# Patient Record
Sex: Female | Born: 1980 | Race: Black or African American | Hispanic: No | Smoking: Never smoker
Health system: Southern US, Community
[De-identification: ages and names within clinical notes are randomized; demographics above are authoritative.]

## PROBLEM LIST (undated history)

## (undated) DIAGNOSIS — D649 Anemia, unspecified: Secondary | ICD-10-CM

---

## 2006-06-10 ENCOUNTER — Other Ambulatory Visit: Admission: RE | Admit: 2006-06-10 | Discharge: 2006-06-10 | Payer: Self-pay | Admitting: Obstetrics and Gynecology

## 2006-08-20 ENCOUNTER — Ambulatory Visit (HOSPITAL_COMMUNITY): Admission: RE | Admit: 2006-08-20 | Discharge: 2006-08-20 | Payer: Self-pay | Admitting: Obstetrics and Gynecology

## 2006-11-19 ENCOUNTER — Other Ambulatory Visit: Admission: RE | Admit: 2006-11-19 | Discharge: 2006-11-19 | Payer: Self-pay | Admitting: Obstetrics and Gynecology

## 2008-09-06 IMAGING — US US OB COMP +14 WK
1 series · 14 of 28 positions shown · non-contrast
Comparison: none

OBSTETRICAL ULTRASOUND:

 This ultrasound exam was performed in the [HOSPITAL] Ultrasound Department.  The OB US report was generated in the AS system, and faxed to the ordering physician.  This report is also available in [REDACTED] PACS.

[Series 1: us ob comp +14 wk · 0.26mm/px · 14 of 88 slices shown]
[im 4/88]
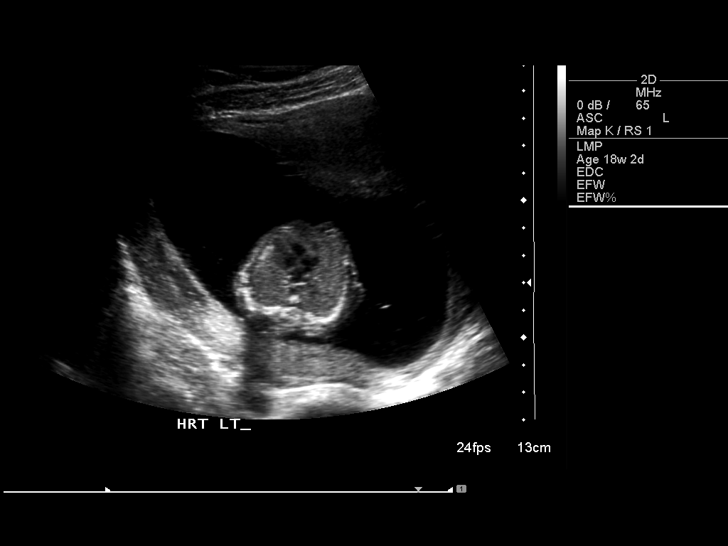
[im 10/88]
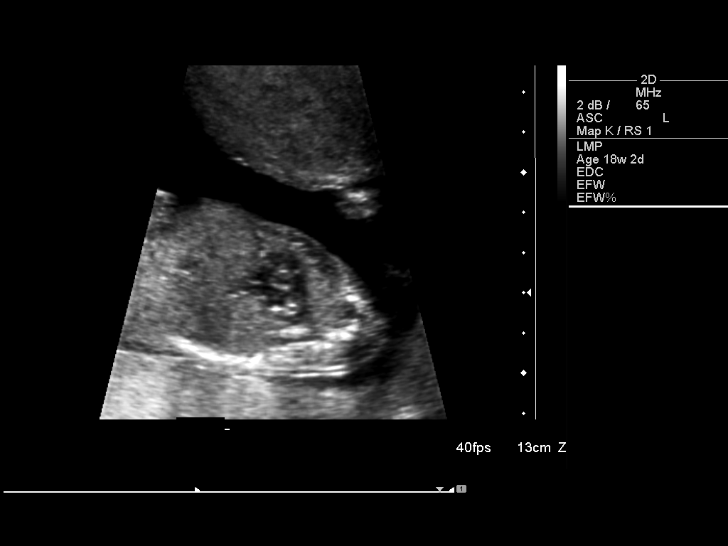
[im 17/88]
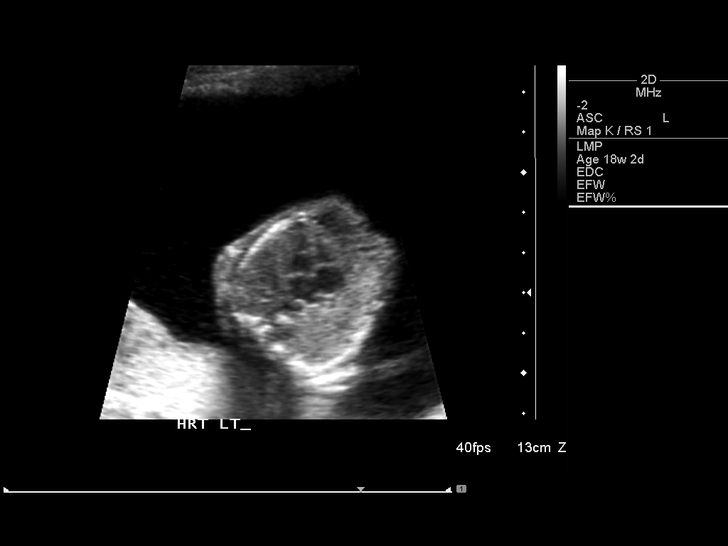
[im 23/88]
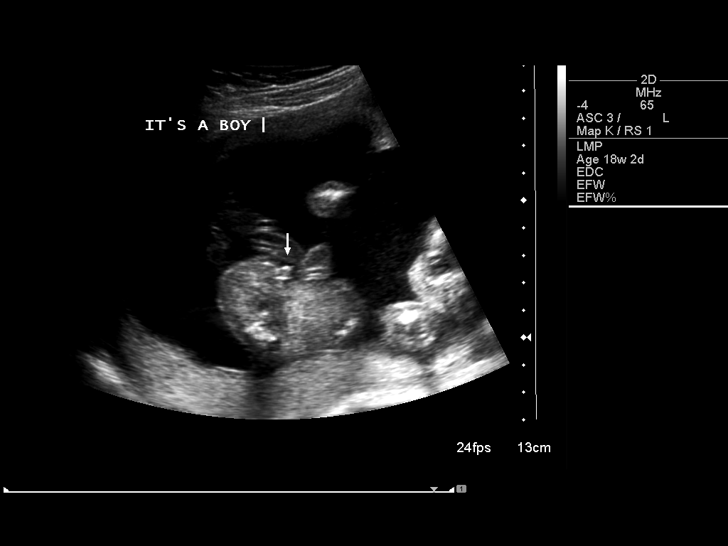
[im 30/88]
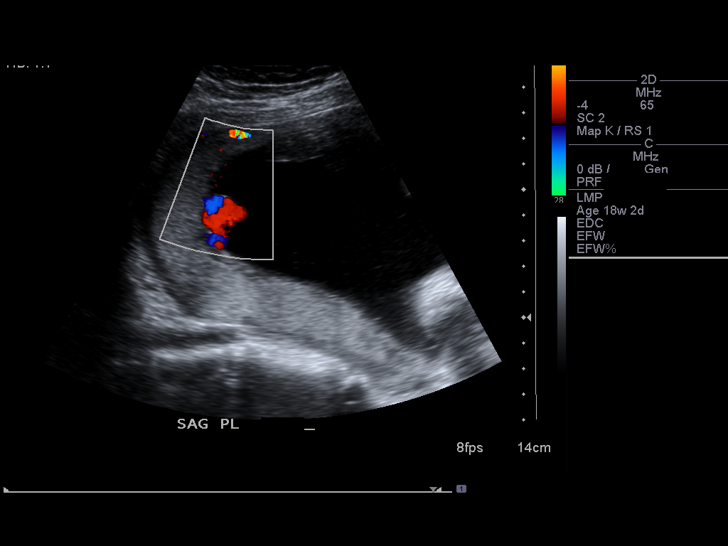
[im 36/88]
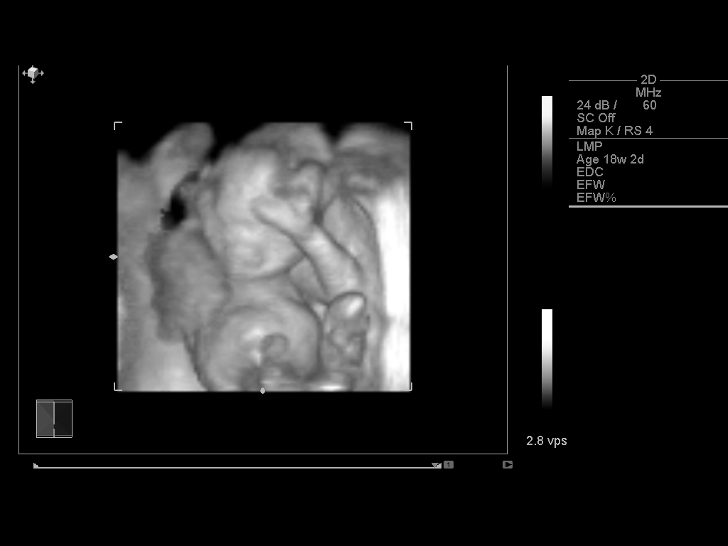
[im 42/88]
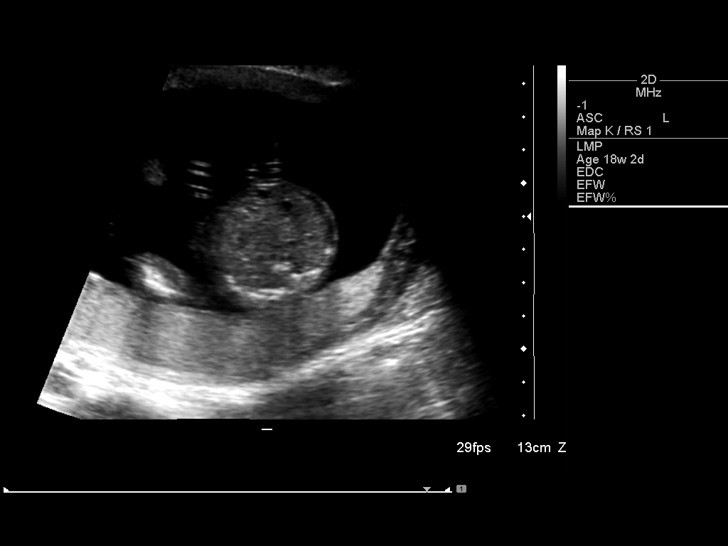
[im 49/88]
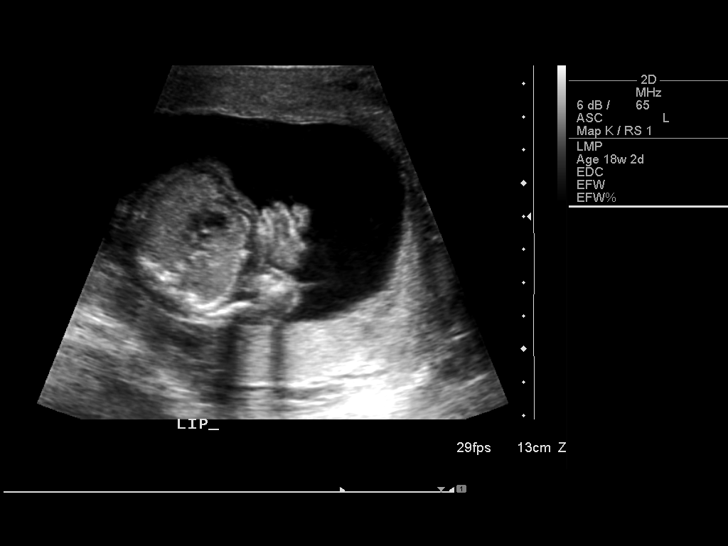
[im 55/88]
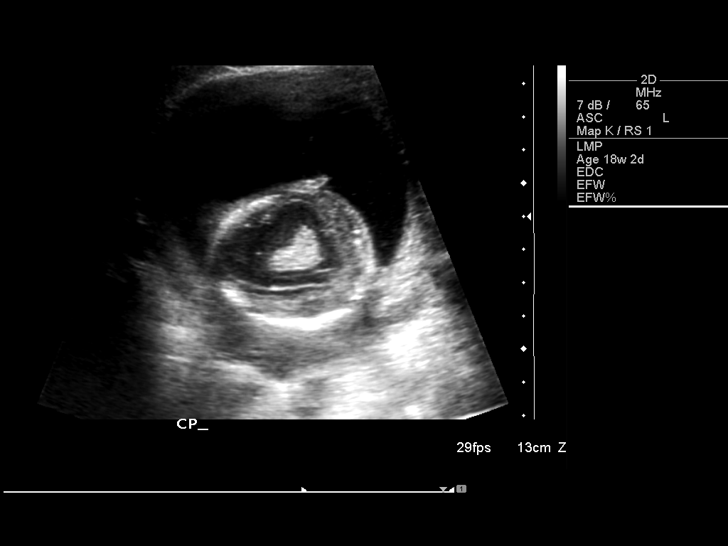
[im 62/88]
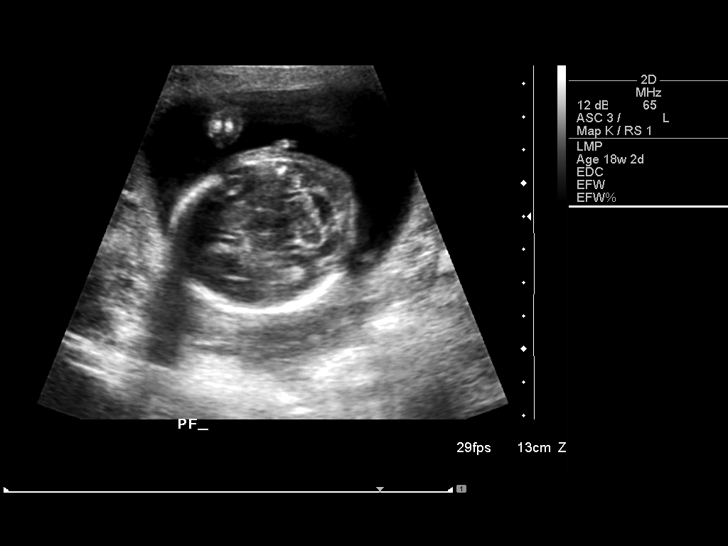
[im 68/88]
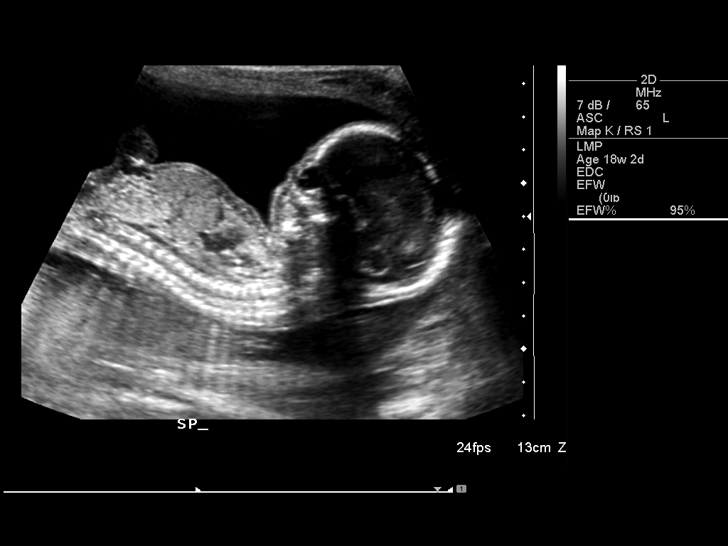
[im 75/88]
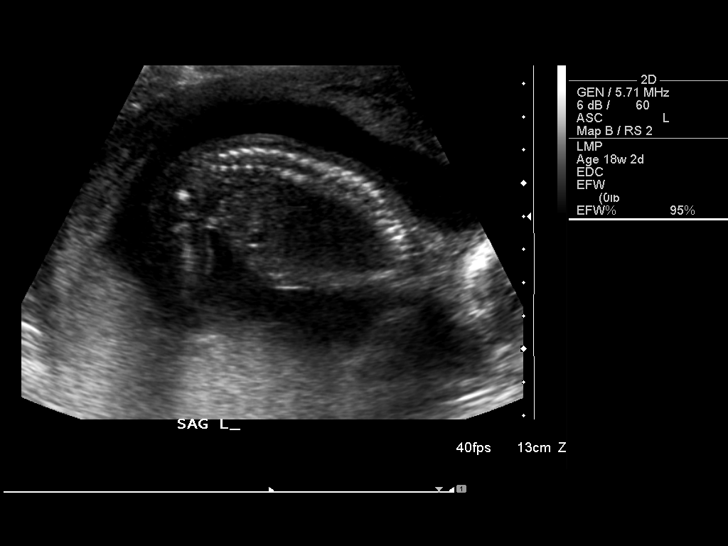
[im 81/88]
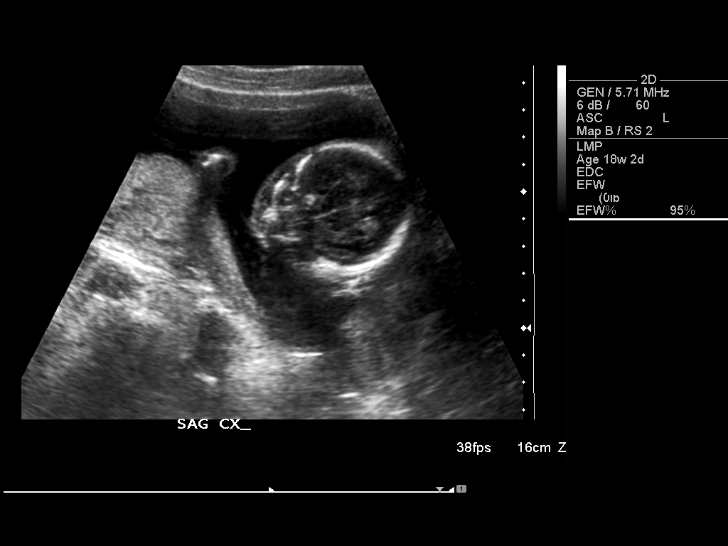
[im 88/88]
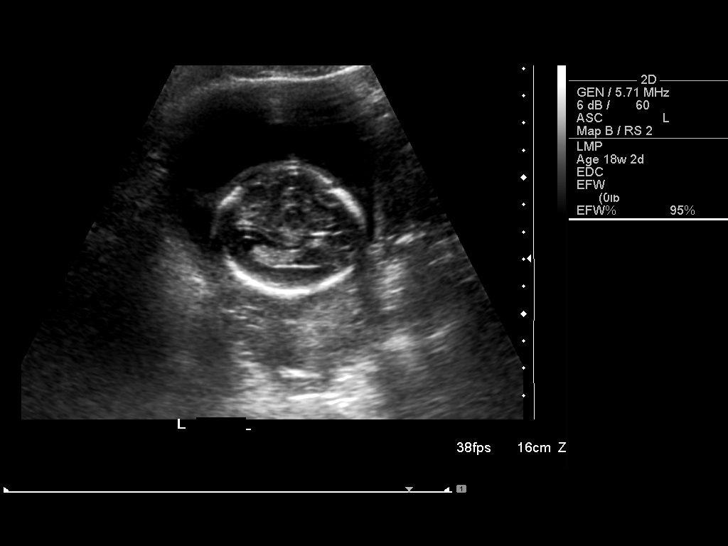

[14 of 28 positions shown; findings below may reference images not displayed]

IMPRESSION: See AS Obstetric US report.

## 2010-06-07 ENCOUNTER — Ambulatory Visit: Payer: Self-pay | Admitting: Internal Medicine

## 2011-07-16 ENCOUNTER — Emergency Department (HOSPITAL_COMMUNITY): Payer: Private Health Insurance - Indemnity

## 2011-07-16 ENCOUNTER — Encounter (HOSPITAL_COMMUNITY): Payer: Self-pay | Admitting: *Deleted

## 2011-07-16 ENCOUNTER — Emergency Department (HOSPITAL_COMMUNITY)
Admission: EM | Admit: 2011-07-16 | Discharge: 2011-07-16 | Disposition: A | Discharge status: Home / Self Care | Payer: Private Health Insurance - Indemnity | Attending: Emergency Medicine | Admitting: Emergency Medicine

## 2011-07-16 DIAGNOSIS — S82899A Other fracture of unspecified lower leg, initial encounter for closed fracture: Secondary | ICD-10-CM | POA: Insufficient documentation

## 2011-07-16 DIAGNOSIS — M25579 Pain in unspecified ankle and joints of unspecified foot: Secondary | ICD-10-CM | POA: Insufficient documentation

## 2011-07-16 DIAGNOSIS — S82409A Unspecified fracture of shaft of unspecified fibula, initial encounter for closed fracture: Secondary | ICD-10-CM

## 2011-07-16 DIAGNOSIS — R55 Syncope and collapse: Secondary | ICD-10-CM

## 2011-07-16 DIAGNOSIS — X58XXXA Exposure to other specified factors, initial encounter: Secondary | ICD-10-CM | POA: Insufficient documentation

## 2011-07-16 LAB — URINALYSIS, ROUTINE W REFLEX MICROSCOPIC
Glucose, UA: NEGATIVE mg/dL
Hgb urine dipstick: NEGATIVE
Ketones, ur: NEGATIVE mg/dL
Leukocytes, UA: NEGATIVE
Protein, ur: NEGATIVE mg/dL
Urobilinogen, UA: 0.2 mg/dL (ref 0.0–1.0)

## 2011-07-16 LAB — BASIC METABOLIC PANEL
Calcium: 9.5 mg/dL (ref 8.4–10.5)
Creatinine, Ser: 1.05 mg/dL (ref 0.50–1.10)
GFR calc non Af Amer: 70 mL/min — ABNORMAL LOW (ref >90)
Sodium: 138 mEq/L (ref 135–145)

## 2011-07-16 LAB — PREGNANCY, URINE: Preg Test, Ur: NEGATIVE

## 2011-07-16 MED ORDER — HYDROCODONE-ACETAMINOPHEN 5-325 MG PO TABS: 1.0000 | ORAL_TABLET | ORAL | Status: DC | PRN

## 2011-07-16 MED ORDER — ACETAMINOPHEN 325 MG PO TABS
650.0000 mg | ORAL_TABLET | Freq: Once | ORAL | Status: AC
  Administered 2011-07-16: 650 mg via ORAL
  Filled 2011-07-16: qty 2

## 2011-07-16 NOTE — ED Notes (Signed)
Reports had a syncopal episode last night at 2200 and now also c/o left ankle pain as a result. Denied CP, dizziness, n/v/d.

## 2011-07-16 NOTE — ED Notes (Signed)
States got up from couch to go into kitchen & then had a syncopal episode. States felt "hot" prior to syncope (unwitnessed) & was "out" for approx 10 min. Denies n/v/d, palpitations, CP, h/a, dizziness, fever, cold, cough. No UTI Sx's.  Reports felt "fine" afterwards.

## 2011-07-16 NOTE — Progress Notes (Signed)
Orthopedic Tech Progress Note Patient Details:  Autumn Bishop 05-02-1980 [id number]  Ortho Devices Type of Ortho Device: CAM walker Ortho Device/Splint Location: left LE Ortho Device/Splint Interventions: Application   Hamilton Marinello T 07/16/2011, 11:38 AM

## 2011-07-16 NOTE — Discharge Instructions (Signed)
FOLLOW UP WITH DR. Gerda Diss THIS WEEK FOR RECHECK AND ANY FURTHER OUTPATIENT EVALUATION FOR PASSING OUT APPROPRIATE. USE CAM WALKER TO AMBULATE WITH FRACTURE OF FIBULA. ICE AND ELEVATE TO REDUCE SWELLING. FOLLOW UP WITH DR. DUDA FOR FURTHER MANAGEMENT OF FRACTURE. RETURN HERE WITH ANY RECURRENT FAINTING, CHEST PAIN, SHORTNESS OF BREATH, SENSATION OF SKIPPING HEART BEATS OR FAST HEART RATE, OR FOR NEW CONCERN.  Syncope You have had a fainting (syncopal) spell. A fainting episode is a sudden, short-lived loss of consciousness. It results in complete recovery. It occurs because there has been a temporary shortage of oxygen and/or sugar (glucose) to the brain. CAUSES   Blood pressure pills and other medications that may lower blood pressure below normal. Sudden changes in posture (sudden standing).   Over-medication. Take your medications as directed.   Standing too long. This can cause blood to pool in the legs.   Seizure disorders.   Low blood sugar (hypoglycemia) of diabetes. This more commonly causes coma.   Bearing down to go to the bathroom. This can cause your blood pressure to rise suddenly. Your body compensates by making the blood pressure too low when you stop bearing down.   Hardening of the arteries where the brain temporarily does not receive enough blood.   Irregular heart beat and circulatory problems.   Fear, emotional distress, injury, sight of blood, or illness.  Your caregiver will send you home if the syncope was from non-worrisome causes (benign). Depending on your age and health, you may stay to be monitored and observed. If you return home, have someone stay with you if your caregiver feels that is desirable. It is very important to keep all follow-up referrals and appointments in order to properly manage this condition. This is a serious problem which can lead to serious illness and death if not carefully managed.  WARNING: Do not drive or operate machinery until your  caregiver feels that it is safe for you to do so. SEEK IMMEDIATE MEDICAL CARE IF:   You have another fainting episode or faint while lying or sitting down. DO NOT DRIVE YOURSELF. Call 911 if no other help is available.   You have chest pain, are feeling sick to your stomach (nausea), vomiting or abdominal pain.   You have an irregular heartbeat or one that is very fast (pulse over 120 beats per minute).   You have a loss of feeling in some part of your body or lose movement in your arms or legs.   You have difficulty with speech, confusion, severe weakness, or visual problems.   You become sweaty and/or feel light headed.  Make sure you are rechecked as instructed. Document Released: 01/06/2005 Document Revised: 12/26/2010 Document Reviewed: 08/27/2006 [facility] Patient Information 2012 [place], [place].  Ankle Fracture A fracture is a break in the bone. A cast or splint is used to protect and keep your injured bone from moving.  HOME CARE INSTRUCTIONS   Use your crutches as directed.   To lessen the swelling, keep the injured leg elevated while sitting or lying down.   Apply ice to the injury for 15 to 20 minutes, 3 to 4 times per day while awake for 2 days. Put the ice in a plastic bag and place a thin towel between the bag of ice and your cast.   If you have a plaster or fiberglass cast:   Do not try to scratch the skin under the cast using sharp or pointed objects.   Check the skin  around the cast every day. You may put lotion on any red or sore areas.   Keep your cast dry and clean.   If you have a plaster splint:   Wear the splint as directed.   You may loosen the elastic around the splint if your toes become numb, tingle, or turn cold or blue.   Do not put pressure on any part of your cast or splint; it may break. Rest your cast only on a pillow the first 24 hours until it is fully hardened.   Your cast or splint can be protected during bathing with a plastic bag. Do  not lower the cast or splint into water.   Take medications as directed by your caregiver. Only take over-the-counter or prescription medicines for pain, discomfort, or fever as directed by your caregiver.   Do not drive a vehicle until your caregiver specifically tells you it is safe to do so.   If your caregiver has given you a follow-up appointment, it is very important to keep that appointment. Not keeping the appointment could result in a chronic or permanent injury, pain, and disability. If there is any problem keeping the appointment, you must call back to this facility for assistance.  SEEK IMMEDIATE MEDICAL CARE IF:   Your cast gets damaged or breaks.   You have continued severe pain or more swelling than you did before the cast was put on.   Your skin or toenails below the injury turn blue or gray, or feel cold or numb.   There is a bad smell or new stains and/or purulent (pus like) drainage coming from under the cast.  If you do not have a window in your cast for observing the wound, a discharge or minor bleeding may show up as a stain on the outside of your cast. Report these findings to your caregiver. MAKE SURE YOU:   Understand these instructions.   Will watch your condition.   Will get help right away if you are not doing well or get worse.  Document Released: 01/04/2000 Document Revised: 12/26/2010 Document Reviewed: 08/10/2007 [facility] Patient Information 2012 [place], [place].

## 2011-07-16 NOTE — ED Provider Notes (Signed)
History     CSN: [id number]  Arrival date & time 07/16/11  0847   First MD Initiated Contact with Patient 07/16/11 [phone]      Chief Complaint  Patient presents with  . Near Syncope  . Ankle Pain    (Consider location/radiation/quality/duration/timing/severity/associated sxs/prior treatment) Patient is a 31 y.o. female presenting with ankle pain and syncope. The history is provided by the patient.  Ankle Pain  Pertinent negatives include no numbness.  Loss of Consciousness This is a new problem. The current episode started yesterday. Pertinent negatives include no abdominal pain, chest pain, coughing, fever, nausea, numbness, rash, vomiting or weakness. Associated symptoms comments: She states she had a single syncopal episode last night that she estimates lasted 15 minutes. She denies pre-syncopal symptoms - no dizziness, pain, palpitations or nausea. She has been well recently without illness. She denies changes in her menses. No urinary symptoms. When she woke, she had left ankle pain which brought her in for evaluation today. She denies history of previous syncope. She denies alcohol or drug use. There was no urinary incontinence or vomiting during her episode.Marland Kitchen    History reviewed. No pertinent past medical history.  History reviewed. No pertinent past surgical history.  History reviewed. No pertinent family history.  History  Substance Use Topics  . Smoking status: Never Smoker   . Smokeless tobacco: Not on file  . Alcohol Use:      occasionally    OB History    Grav Para Term Preterm Abortions TAB SAB Ect Mult Living                  Review of Systems  Constitutional: Negative for fever.  Eyes: Negative for visual disturbance.  Respiratory: Negative for cough and shortness of breath.   Cardiovascular: Positive for syncope. Negative for chest pain and palpitations.  Gastrointestinal: Negative for nausea, vomiting, abdominal pain and blood in stool.  Genitourinary:  Negative for dysuria, vaginal bleeding, vaginal discharge and menstrual problem.  Musculoskeletal: Negative for back pain.       See HPI.  Skin: Negative for rash.  Neurological: Positive for syncope. Negative for dizziness, weakness, light-headedness and numbness.  Psychiatric/Behavioral: Negative for disturbed wake/sleep cycle and dysphoric mood. The patient is not nervous/anxious.     Allergies  Review of patient's allergies indicates no known allergies.  Home Medications  No current outpatient prescriptions on file.  BP 130/91  Pulse 87  Temp 99 F (37.2 C) (Oral)  Resp 16  Ht 5\' 9"  (1.753 m)  Wt 190 lb (86.183 kg)  BMI 28.06 kg/m2  SpO2 96%  LMP 01/15/2007  Physical Exam  Constitutional: She is oriented to person, place, and time. She appears well-developed and well-nourished. No distress.  HENT:  Head: Normocephalic and atraumatic.  Eyes: Pupils are equal, round, and reactive to light.  Neck: Normal range of motion. Neck supple.       No carotid bruit.  Cardiovascular: Normal rate and regular rhythm.   No murmur heard. Pulmonary/Chest: Effort normal and breath sounds normal. She has no wheezes.  Abdominal: Soft. She exhibits no mass. There is no tenderness. There is no rebound and no guarding.  Musculoskeletal: Normal range of motion.       Left ankle tender laterally with moderate swelling and mild ecchymosis. No medial tenderness. Joint stability not tested due to pain. Distal sensation without deficit. Pulse distally WNL.  Neurological: She is alert and oriented to person, place, and time. She displays normal reflexes.  She exhibits normal muscle tone. Coordination normal.       Cranial nerves 3-12 grossly intact.   Skin: Skin is warm and dry.  Psychiatric: She has a normal mood and affect.    ED Course  Procedures (including critical care time)  Labs Reviewed  BASIC METABOLIC PANEL - Abnormal; Notable for the following:    GFR calc non Af Amer 70 (*)     GFR  calc Af Amer 81 (*)     All other components within normal limits  URINALYSIS, ROUTINE W REFLEX MICROSCOPIC  PREGNANCY, URINE   Dg Ankle Complete Left  07/16/2011  *RADIOLOGY REPORT*  Clinical Data: History of syncopal episode.  History of pain and swelling involving the left ankle.  LEFT ANKLE COMPLETE - 3+ VIEW  Comparison: None.  Findings: There is soft tissue swelling.  There is oblique fracture of the distal diaphysis and metaphysis of the fibula.  There is slight lateral displacement, posterior displacement, and valgus angulation of the distal fibular fracture fragment.  No dislocation is evident. No comminution is seen.  No other fracture is evident.  IMPRESSION: Fracture of fibula.  Soft tissue swelling.  Original Report Authenticated By: Crawford Givens, M.D.   Results for orders placed during the hospital encounter of 07/16/11  BASIC METABOLIC PANEL      Component Value Range   Sodium 138  135 - 145 mEq/L   Potassium 3.7  3.5 - 5.1 mEq/L   Chloride 100  96 - 112 mEq/L   CO2 27  19 - 32 mEq/L   Glucose, Bld 95  70 - 99 mg/dL   BUN 8  6 - 23 mg/dL   Creatinine, Ser 5.40  0.50 - 1.10 mg/dL   Calcium 9.5  8.4 - 98.1 mg/dL   GFR calc non Af Amer 70 (*) >90 mL/min   GFR calc Af Amer 81 (*) >90 mL/min  URINALYSIS, ROUTINE W REFLEX MICROSCOPIC      Component Value Range   Color, Urine YELLOW  YELLOW   APPearance CLEAR  CLEAR   Specific Gravity, Urine 1.012  1.005 - 1.030   pH 6.0  5.0 - 8.0   Glucose, UA NEGATIVE  NEGATIVE mg/dL   Hgb urine dipstick NEGATIVE  NEGATIVE   Bilirubin Urine NEGATIVE  NEGATIVE   Ketones, ur NEGATIVE  NEGATIVE mg/dL   Protein, ur NEGATIVE  NEGATIVE mg/dL   Urobilinogen, UA 0.2  0.0 - 1.0 mg/dL   Nitrite NEGATIVE  NEGATIVE   Leukocytes, UA NEGATIVE  NEGATIVE  PREGNANCY, URINE      Component Value Range   Preg Test, Ur NEGATIVE  NEGATIVE    Date: 07/16/2011  Rate: 92  Rhythm: normal sinus rhythm  QRS Axis: normal  Intervals: normal  ST/T Wave  abnormalities: normal  Conduction Disutrbances:none  Narrative Interpretation:   Old EKG Reviewed: none available    No diagnosis found.  1. Syncope 2. Fibula fracture   MDM  She is alert and oriented. No cause for syncope determined but patient appears stable with normal EKG and VSS. Feel she can be discharged home with primary care follow up. She is being referred to orthopedics for fibula fracture - CAM walker provided. She declined pain medication here but will discharge with Rx should she decide to fill it.        Rodena Medin, PA-C 07/16/11 1136

## 2011-07-17 NOTE — ED Provider Notes (Signed)
Medical screening examination/treatment/procedure(s) were performed by non-physician practitioner and as supervising physician I was immediately available for consultation/collaboration.   Benny Lennert, MD 07/17/11 [phone]

## 2011-07-23 NOTE — Progress Notes (Signed)
Dr Krista Blue notified of patients syncopal epsiode that caused the fall that resulted in the fracture.  No new orders.

## 2011-07-25 ENCOUNTER — Encounter (HOSPITAL_COMMUNITY): Payer: Self-pay | Admitting: Anesthesiology

## 2011-07-25 ENCOUNTER — Ambulatory Visit (HOSPITAL_COMMUNITY)
Admission: AD | Admit: 2011-07-25 | Discharge: 2011-07-26 | Disposition: A | Discharge status: Home / Self Care | Payer: Private Health Insurance - Indemnity | Source: Ambulatory Visit | Attending: Orthopedic Surgery | Admitting: Orthopedic Surgery

## 2011-07-25 ENCOUNTER — Encounter (HOSPITAL_COMMUNITY): Payer: Self-pay | Admitting: *Deleted

## 2011-07-25 ENCOUNTER — Other Ambulatory Visit (HOSPITAL_COMMUNITY): Payer: Self-pay | Admitting: Orthopedic Surgery

## 2011-07-25 ENCOUNTER — Ambulatory Visit (HOSPITAL_COMMUNITY): Payer: Private Health Insurance - Indemnity | Admitting: Anesthesiology

## 2011-07-25 ENCOUNTER — Encounter (HOSPITAL_COMMUNITY)
Admission: AD | Disposition: A | Discharge status: Home / Self Care | Payer: Self-pay | Source: Ambulatory Visit | Attending: Orthopedic Surgery

## 2011-07-25 DIAGNOSIS — S82899A Other fracture of unspecified lower leg, initial encounter for closed fracture: Secondary | ICD-10-CM | POA: Insufficient documentation

## 2011-07-25 DIAGNOSIS — S8263XA Displaced fracture of lateral malleolus of unspecified fibula, initial encounter for closed fracture: Secondary | ICD-10-CM

## 2011-07-25 DIAGNOSIS — W19XXXA Unspecified fall, initial encounter: Secondary | ICD-10-CM | POA: Insufficient documentation

## 2011-07-25 HISTORY — PX: ORIF ANKLE FRACTURE: SHX5408

## 2011-07-25 HISTORY — DX: Anemia, unspecified: D64.9

## 2011-07-25 LAB — CBC
HCT: 38.3 % (ref 36.0–46.0)
Hemoglobin: 12.8 g/dL (ref 12.0–15.0)
MCV: 85.9 fL (ref 78.0–100.0)
RDW: 12.4 % (ref 11.5–15.5)
WBC: 10.7 10*3/uL — ABNORMAL HIGH (ref 4.0–10.5)

## 2011-07-25 LAB — COMPREHENSIVE METABOLIC PANEL
Albumin: 4.2 g/dL (ref 3.5–5.2)
BUN: 13 mg/dL (ref 6–23)
Calcium: 9.6 mg/dL (ref 8.4–10.5)
Creatinine, Ser: 0.86 mg/dL (ref 0.50–1.10)
Total Protein: 7.7 g/dL (ref 6.0–8.3)

## 2011-07-25 LAB — HCG, SERUM, QUALITATIVE: Preg, Serum: NEGATIVE

## 2011-07-25 SURGERY — OPEN REDUCTION INTERNAL FIXATION (ORIF) ANKLE FRACTURE
Anesthesia: General | Site: Ankle | Laterality: Left | Wound class: Clean

## 2011-07-25 MED ORDER — HYDROMORPHONE HCL PF 1 MG/ML IJ SOLN: 0.2500 mg | INTRAMUSCULAR | Status: DC | PRN

## 2011-07-25 MED ORDER — ONDANSETRON HCL 4 MG/2ML IJ SOLN: 4.0000 mg | Freq: Four times a day (QID) | INTRAMUSCULAR | Status: DC | PRN

## 2011-07-25 MED ORDER — HYDROCODONE-ACETAMINOPHEN 5-325 MG PO TABS
1.0000 | ORAL_TABLET | ORAL | Status: DC | PRN
  Administered 2011-07-25 – 2011-07-26 (×3): 2 via ORAL
  Filled 2011-07-25 (×3): qty 2

## 2011-07-25 MED ORDER — FENTANYL CITRATE 0.05 MG/ML IJ SOLN
INTRAMUSCULAR | Status: DC | PRN
  Administered 2011-07-25: 100 ug via INTRAVENOUS

## 2011-07-25 MED ORDER — MIDAZOLAM HCL 2 MG/2ML IJ SOLN
INTRAMUSCULAR | Status: AC
  Filled 2011-07-25: qty 2

## 2011-07-25 MED ORDER — METOCLOPRAMIDE HCL 5 MG/ML IJ SOLN: 5.0000 mg | Freq: Three times a day (TID) | INTRAMUSCULAR | Status: DC | PRN

## 2011-07-25 MED ORDER — CEFAZOLIN SODIUM-DEXTROSE 2-3 GM-% IV SOLR
2.0000 g | INTRAVENOUS | Status: AC
  Administered 2011-07-25: 2 g via INTRAVENOUS

## 2011-07-25 MED ORDER — METHOCARBAMOL 500 MG PO TABS
500.0000 mg | ORAL_TABLET | Freq: Four times a day (QID) | ORAL | Status: DC | PRN
  Administered 2011-07-25 – 2011-07-26 (×2): 500 mg via ORAL
  Filled 2011-07-25 (×3): qty 1

## 2011-07-25 MED ORDER — ACETAMINOPHEN 10 MG/ML IV SOLN: 1000.0000 mg | Freq: Once | INTRAVENOUS | Status: DC | PRN

## 2011-07-25 MED ORDER — LIDOCAINE HCL (CARDIAC) 20 MG/ML IV SOLN
INTRAVENOUS | Status: DC | PRN
  Administered 2011-07-25: 50 mg via INTRAVENOUS

## 2011-07-25 MED ORDER — ONDANSETRON HCL 4 MG/2ML IJ SOLN: 4.0000 mg | Freq: Once | INTRAMUSCULAR | Status: DC | PRN

## 2011-07-25 MED ORDER — METHOCARBAMOL 100 MG/ML IJ SOLN
500.0000 mg | Freq: Four times a day (QID) | INTRAVENOUS | Status: DC | PRN
  Filled 2011-07-25: qty 5

## 2011-07-25 MED ORDER — LACTATED RINGERS IV SOLN
INTRAVENOUS | Status: DC
  Administered 2011-07-25: 16:00:00 via INTRAVENOUS

## 2011-07-25 MED ORDER — LACTATED RINGERS IV SOLN
INTRAVENOUS | Status: DC | PRN
  Administered 2011-07-25: 16:00:00 via INTRAVENOUS

## 2011-07-25 MED ORDER — OXYCODONE-ACETAMINOPHEN 5-325 MG PO TABS: 1.0000 | ORAL_TABLET | ORAL | Status: DC | PRN

## 2011-07-25 MED ORDER — PROPOFOL 10 MG/ML IV EMUL
INTRAVENOUS | Status: DC | PRN
  Administered 2011-07-25: 200 mg via INTRAVENOUS

## 2011-07-25 MED ORDER — DROPERIDOL 2.5 MG/ML IJ SOLN: 0.6250 mg | INTRAMUSCULAR | Status: DC | PRN

## 2011-07-25 MED ORDER — MIDAZOLAM HCL 2 MG/2ML IJ SOLN
2.0000 mg | INTRAMUSCULAR | Status: DC | PRN
  Administered 2011-07-25: 2 mg via INTRAVENOUS

## 2011-07-25 MED ORDER — CEFAZOLIN SODIUM-DEXTROSE 2-3 GM-% IV SOLR
2.0000 g | Freq: Four times a day (QID) | INTRAVENOUS | Status: AC
  Administered 2011-07-25 – 2011-07-26 (×3): 2 g via INTRAVENOUS
  Filled 2011-07-25 (×3): qty 50

## 2011-07-25 MED ORDER — HYDROCODONE-ACETAMINOPHEN 5-500 MG PO TABS: 1.0000 | ORAL_TABLET | Freq: Four times a day (QID) | ORAL | Status: AC | PRN

## 2011-07-25 MED ORDER — FENTANYL CITRATE 0.05 MG/ML IJ SOLN
100.0000 ug | INTRAMUSCULAR | Status: DC | PRN
  Administered 2011-07-25: 100 ug via INTRAVENOUS

## 2011-07-25 MED ORDER — METOCLOPRAMIDE HCL 10 MG PO TABS: 5.0000 mg | ORAL_TABLET | Freq: Three times a day (TID) | ORAL | Status: DC | PRN

## 2011-07-25 MED ORDER — ONDANSETRON HCL 4 MG PO TABS: 4.0000 mg | ORAL_TABLET | Freq: Four times a day (QID) | ORAL | Status: DC | PRN

## 2011-07-25 MED ORDER — HYDROMORPHONE HCL PF 1 MG/ML IJ SOLN
0.5000 mg | INTRAMUSCULAR | Status: DC | PRN
  Administered 2011-07-26: 1 mg via INTRAVENOUS
  Filled 2011-07-25: qty 1

## 2011-07-25 MED ORDER — FENTANYL CITRATE 0.05 MG/ML IJ SOLN
INTRAMUSCULAR | Status: AC
  Filled 2011-07-25: qty 2

## 2011-07-25 MED ORDER — 0.9 % SODIUM CHLORIDE (POUR BTL) OPTIME
TOPICAL | Status: DC | PRN
  Administered 2011-07-25: 1000 mL

## 2011-07-25 MED ORDER — SODIUM CHLORIDE 0.9 % IV SOLN
INTRAVENOUS | Status: DC
  Administered 2011-07-26: 20 mL via INTRAVENOUS

## 2011-07-25 SURGICAL SUPPLY — 57 items
BANDAGE ESMARK 6X9 LF (GAUZE/BANDAGES/DRESSINGS) IMPLANT
BANDAGE GAUZE ELAST BULKY 4 IN (GAUZE/BANDAGES/DRESSINGS) ×1 IMPLANT
BIT DRILL 2.5X110 QC LCP DISP (BIT) ×1 IMPLANT
BIT DRILL 2.8 (BIT) ×1
BIT DRILL CANN QC 2.8X165 (BIT) IMPLANT
BNDG CMPR 9X6 STRL LF SNTH (GAUZE/BANDAGES/DRESSINGS)
BNDG COHESIVE 4X5 TAN STRL (GAUZE/BANDAGES/DRESSINGS) ×2 IMPLANT
BNDG COHESIVE 6X5 TAN STRL LF (GAUZE/BANDAGES/DRESSINGS) ×1 IMPLANT
BNDG ESMARK 6X9 LF (GAUZE/BANDAGES/DRESSINGS)
BNDG GAUZE STRTCH 6 (GAUZE/BANDAGES/DRESSINGS) ×2 IMPLANT
CLOTH BEACON ORANGE TIMEOUT ST (SAFETY) ×2 IMPLANT
COVER SURGICAL LIGHT HANDLE (MISCELLANEOUS) ×2 IMPLANT
CUFF TOURNIQUET SINGLE 34IN LL (TOURNIQUET CUFF) IMPLANT
CUFF TOURNIQUET SINGLE 44IN (TOURNIQUET CUFF) IMPLANT
DRAPE C-ARM MINI 42X72 WSTRAPS (DRAPES) ×1 IMPLANT
DRAPE INCISE IOBAN 66X45 STRL (DRAPES) ×2 IMPLANT
DRAPE PROXIMA HALF (DRAPES) ×2 IMPLANT
DRAPE U-SHAPE 47X51 STRL (DRAPES) ×2 IMPLANT
DRILL BIT 2.8MM (BIT) ×2
DRSG ADAPTIC 3X8 NADH LF (GAUZE/BANDAGES/DRESSINGS) ×2 IMPLANT
DRSG PAD ABDOMINAL 8X10 ST (GAUZE/BANDAGES/DRESSINGS) ×2 IMPLANT
DURAPREP 26ML APPLICATOR (WOUND CARE) ×2 IMPLANT
ELECT REM PT RETURN 9FT ADLT (ELECTROSURGICAL) ×2
ELECTRODE REM PT RTRN 9FT ADLT (ELECTROSURGICAL) ×1 IMPLANT
GLOVE BIOGEL PI IND STRL 9 (GLOVE) ×1 IMPLANT
GLOVE BIOGEL PI INDICATOR 9 (GLOVE) ×1
GLOVE SURG ORTHO 9.0 STRL STRW (GLOVE) ×2 IMPLANT
GOWN PREVENTION PLUS XLARGE (GOWN DISPOSABLE) ×2 IMPLANT
GOWN SRG XL XLNG 56XLVL 4 (GOWN DISPOSABLE) ×2 IMPLANT
GOWN STRL NON-REIN XL XLG LVL4 (GOWN DISPOSABLE) ×2
KIT 1/3 TUB PL 7H 85M (Orthopedic Implant) IMPLANT
KIT BASIN OR (CUSTOM PROCEDURE TRAY) ×2 IMPLANT
KIT ROOM TURNOVER OR (KITS) ×2 IMPLANT
MANIFOLD NEPTUNE II (INSTRUMENTS) ×2 IMPLANT
NS IRRIG 1000ML POUR BTL (IV SOLUTION) ×2 IMPLANT
PACK ORTHO EXTREMITY (CUSTOM PROCEDURE TRAY) ×2 IMPLANT
PAD ARMBOARD 7.5X6 YLW CONV (MISCELLANEOUS) ×4 IMPLANT
PADDING CAST COTTON 6X4 STRL (CAST SUPPLIES) ×2 IMPLANT
PROS 1/3 TUB PL 7H 85M (Orthopedic Implant) ×2 IMPLANT
SCREW CORTEX 3.5 12MM (Screw) ×3 IMPLANT
SCREW CORTEX 3.5 22MM (Screw) ×1 IMPLANT
SCREW LOCK CORT ST 3.5X12 (Screw) IMPLANT
SCREW LOCK CORT ST 3.5X22 (Screw) IMPLANT
SCREW LOCK T15 FT 14X3.5X2.9X (Screw) IMPLANT
SCREW LOCKING 3.5X14 (Screw) ×2 IMPLANT
SPONGE GAUZE 4X4 12PLY (GAUZE/BANDAGES/DRESSINGS) ×2 IMPLANT
SPONGE LAP 18X18 X RAY DECT (DISPOSABLE) ×2 IMPLANT
STAPLER VISISTAT 35W (STAPLE) ×1 IMPLANT
SUCTION FRAZIER TIP 10 FR DISP (SUCTIONS) ×2 IMPLANT
SUT ETHILON 2 0 PSLX (SUTURE) IMPLANT
SUT VIC AB 0 CT1 27 (SUTURE) ×2
SUT VIC AB 0 CT1 27XBRD ANBCTR (SUTURE) IMPLANT
SUT VIC AB 2-0 CTB1 (SUTURE) ×4 IMPLANT
TOWEL OR 17X24 6PK STRL BLUE (TOWEL DISPOSABLE) ×2 IMPLANT
TOWEL OR 17X26 10 PK STRL BLUE (TOWEL DISPOSABLE) ×2 IMPLANT
TUBE CONNECTING 12X1/4 (SUCTIONS) ×2 IMPLANT
WATER STERILE IRR 1000ML POUR (IV SOLUTION) ×1 IMPLANT

## 2011-07-25 NOTE — Anesthesia Preprocedure Evaluation (Signed)
Anesthesia Evaluation  Patient identified by MRN, date of birth, ID band Patient awake    Reviewed: Allergy & Precautions, H&P , NPO status , Patient's Chart, lab work & pertinent test results  Airway Mallampati: II  Neck ROM: Full    Dental  (+) Teeth Intact   Pulmonary  breath sounds clear to auscultation        Cardiovascular Rhythm:Regular Rate:Normal     Neuro/Psych    GI/Hepatic   Endo/Other    Renal/GU      Musculoskeletal   Abdominal (+)  Abdomen: soft.    Peds  Hematology   Anesthesia Other Findings   Reproductive/Obstetrics                           Anesthesia Physical Anesthesia Plan  ASA: II  Anesthesia Plan: General   Post-op Pain Management:    Induction: Intravenous  Airway Management Planned: LMA  Additional Equipment:   Intra-op Plan:   Post-operative Plan:   Informed Consent: I have reviewed the patients History and Physical, chart, labs and discussed the procedure including the risks, benefits and alternatives for the proposed anesthesia with the patient or authorized representative who has indicated his/her understanding and acceptance.   Dental advisory given  Plan Discussed with:   Anesthesia Plan Comments: (L. Ankle fracture Mild obesity ? Syncopal episode 6/25 ECG normal, no further episodes  Plan GA with LMA and Popliteal block  Kipp Brood, MD )        Anesthesia Quick Evaluation

## 2011-07-25 NOTE — Preoperative (Signed)
Beta Blockers   Reason not to administer Beta Blockers:Not Applicable 

## 2011-07-25 NOTE — Progress Notes (Signed)
2 crutches, black bag, and two patient belongings bag given to patient's father and mother.

## 2011-07-25 NOTE — Op Note (Signed)
OPERATIVE REPORT  DATE OF SURGERY: 07/25/2011  PATIENT:  Autumn Bishop,  31 y.o. female  PRE-OPERATIVE DIAGNOSIS:  Left ankle fibula fracture  POST-OPERATIVE DIAGNOSIS: Weber B. left fibular fracture  PROCEDURE:  Procedure(s): OPEN REDUCTION INTERNAL FIXATION (ORIF) ANKLE FRACTURE  SURGEON:  Surgeon(s): Nadara Mustard, MD  ANESTHESIA:   regional and general  EBL:  Minimal ML  SPECIMEN:  No Specimen  TOURNIQUET:  * Missing tourniquet times found for documented tourniquets in log:  [id number] *  PROCEDURE DETAILS: Patient is a 31 year old woman who is over a week out from a displaced Weber B. left fibular fracture. She has a non-congruent mortise and presents at this time for open reduction internal fixation. Risks and benefits were discussed including infection neurovascular injury arthritis DVT need for additional surgery. Patient states she understands and wished to proceed at this time. Description of procedure patient was brought to the OR and after after undergoing a popliteal block she then underwent a general anesthetic. After adequate levels of anesthesia were obtained patient's left lower extremity was prepped using DuraPrep and draped into a sterile field. A lateral incision was made over the fibula this was carried sharply down to bone subperiosteal dissection was used to cleanse the fracture site. The fracture was reduced and was stabilized with a interfragmentary screw perpendicular to the fracture site. A one third tubular plate 7 hole in length was then placed in anti-glide glide position posterior laterally and this was secured proximally with 3 nonlocking screws and distally with one locking screw. C-arm fluoroscopy verified reduction. The mortise it congruence was 4. Wound was irrigated normal saline subcutaneous is closed using 0 Vicryl the skin was closed using staples the wound was covered with Adaptic orthopedic sponges AB dressing Kerlix and Coban. Patient was extubated taken  to the PACU in stable condition.  PLAN OF CARE: Admit for overnight observation  PATIENT DISPOSITION:  PACU - hemodynamically stable.   Nadara Mustard, MD 07/25/2011 5:40 PM

## 2011-07-25 NOTE — Anesthesia Procedure Notes (Signed)
Procedure Name: LMA Insertion Date/Time: 07/25/2011 5:03 PM Performed by: Gwenyth Allegra Pre-anesthesia Checklist: Emergency Drugs available, Patient identified, Timeout performed, Suction available and Patient being monitored Patient Re-evaluated:Patient Re-evaluated prior to inductionOxygen Delivery Method: Circle system utilized Preoxygenation: Pre-oxygenation with 100% oxygen Intubation Type: IV induction LMA: LMA inserted LMA Size: 4.0 Number of attempts: 1 Placement Confirmation: positive ETCO2 and breath sounds checked- equal and bilateral Tube secured with: Tape

## 2011-07-25 NOTE — Anesthesia Postprocedure Evaluation (Signed)
  Anesthesia Post-op Note  Patient: Autumn Bishop  Procedure(s) Performed: Procedure(s) (LRB): OPEN REDUCTION INTERNAL FIXATION (ORIF) ANKLE FRACTURE (Left)  Patient Location: PACU  Anesthesia Type: General and GA combined with regional for post-op pain  Level of Consciousness: awake, alert  and oriented  Airway and Oxygen Therapy: Patient Spontanous Breathing  Post-op Pain: none  Post-op Assessment: Post-op Vital signs reviewed and Patient's Cardiovascular Status Stable  Post-op Vital Signs: stable  Complications: No apparent anesthesia complications

## 2011-07-25 NOTE — H&P (Signed)
Autumn Bishop is an 31 y.o. female.   Chief Complaint: Displaced left ankle Weber B. fibular fracture HPI: Patient is a 31 year old woman who is almost a week out from a displaced left ankle fibular fracture Weber B. She has a non-congruent mortise is at risk for arthritis and increased pain with joint breakdown and presents at this time for open reduction internal fixation.  No past medical history on file.  No past surgical history on file.  No family history on file. Social History:  reports that she has never smoked. She does not have any smokeless tobacco history on file. She reports that she does not use illicit drugs. Her alcohol history not on file.  Allergies: No Known Allergies  No prescriptions prior to admission    No results found for this or any previous visit (from the past 48 hour(s)). No results found.  Review of Systems  All other systems reviewed and are negative.    Last menstrual period 01/15/2007. Physical Exam  On examination patient does have some swelling but there is no blisters no cellulitis no signs of infection. She does have good pulses. Radiographs were reviewed which shows a Weber B. displaced fibular fracture with a non-congruent mortise. Assessment/Plan Assessment: Left ankle Weber B. displaced fracture with non-congruent mortise.  Plan: We'll plan for open reduction internal fixation of the fibula. Risks and benefits were discussed including infection neurovascular injury persistent pain DVT need for additional surgery. Patient states she understands and wished to proceed at this time.  DUDA,MARCUS V 07/25/2011, 1:43 PM

## 2011-07-25 NOTE — Transfer of Care (Signed)
Immediate Anesthesia Transfer of Care Note  Patient: Autumn Bishop  Procedure(s) Performed: Procedure(s) (LRB): OPEN REDUCTION INTERNAL FIXATION (ORIF) ANKLE FRACTURE (Left)  Patient Location: PACU  Anesthesia Type: GA combined with regional for post-op pain  Level of Consciousness: sedated  Airway & Oxygen Therapy: Patient Spontanous Breathing and Patient connected to nasal cannula oxygen  Post-op Assessment: Report given to PACU RN and Post -op Vital signs reviewed and stable  Post vital signs: Reviewed and stable  Complications: No apparent anesthesia complications

## 2011-07-25 NOTE — Progress Notes (Signed)
Patient monitor during block. Heart rate 98-100 NSR. Oxygen 100% while on 2 lpm via [place]

## 2011-07-26 NOTE — Progress Notes (Signed)
Subjective: Pt stable - pain controlled   Objective: Vital signs in last 24 hours: Temp:  [97.2 F (36.2 C)-99 F (37.2 C)] 98.1 F (36.7 C) (07/06 0549) Pulse Rate:  [73-90] 90  (07/06 0549) Resp:  [16-26] 18  (07/06 0549) BP: (118-138)/(62-93) 126/77 mmHg (07/06 0549) SpO2:  [95 %-100 %] 99 % (07/06 0549)  Intake/Output from previous day: 07/05 0701 - 07/06 0700 In: 900 [I.V.:900] Out: -  Intake/Output this shift:    Exam:  Neurovascular intact Sensation intact distally Intact pulses distally  Labs:  Basename 07/25/11 1454  HGB 12.8    Basename 07/25/11 1454  WBC 10.7*  RBC 4.46  HCT 38.3  PLT 368    Basename 07/25/11 1454  NA 143  K 4.0  CL 105  CO2 26  BUN 13  CREATININE 0.86  GLUCOSE 91  CALCIUM 9.6   No results found for this basename: LABPT:2,INR:2 in the last 72 hours  Assessment/Plan: Mobilize with PT possible dc monday   Autumn Bishop 07/26/2011, 10:33 AM

## 2011-07-26 NOTE — Discharge Summary (Signed)
Physician Discharge Summary  Patient ID: Autumn Bishop MRN: [id number] DOB/AGE: 19-Aug-1980 31 y.o.  Admit date: 07/25/2011 Discharge date: 07/26/2011  Admission Diagnoses: Weber B. left fibular fracture  Discharge Diagnoses: Weber B. left fibular fracture Active Problems:  * No active hospital problems. *    Discharged Condition: stable  Hospital Course: Patient's hospital course was essentially unremarkable. She underwent open reduction internal fixation for the displaced fibular fracture. She was admitted                       was stable on postoperative day one and wished to be discharged to home and she was discharged to home in stable condition followup in the office in one to 2 weeks.                                                                                                                                                                                                                                                                                                                                                                           Consults: None  Significant Diagnostic Studies: labs: Routine labs   Treatments: surgery: Please see operative note   Discharge Exam: Blood pressure 126/77, pulse 90, temperature 98.1 F (36.7 C), temperature source Oral, resp. rate 18, last menstrual period 01/15/2007, SpO2 99.00%. Incision/Wound: dressing clean and dry at time of discharge  Disposition: 01-Home or Self Care  Discharge Orders    Future Orders Please Complete By Expires   Diet - low sodium heart healthy      Diet - low sodium heart healthy      Call MD / Call 911      Comments:   If you experience  chest pain or shortness of breath, CALL 911 and be transported to the hospital emergency room.  If you develope a fever above 101 F, pus (white drainage) or increased drainage or redness at the wound, or calf pain, call your surgeon's office.   Constipation Prevention      Comments:   Drink plenty of fluids.  Prune juice may be helpful.  You may use a stool softener, such as Colace (over the counter) 100 mg twice a day.  Use MiraLax (over the counter) for constipation as needed.   Increase activity slowly as tolerated      Call MD / Call 911      Comments:   If you experience chest pain or shortness of breath, CALL 911 and be transported to the hospital emergency room.  If you develope a fever above 101 F, pus (white drainage) or increased drainage or redness at the wound, or calf pain, call your surgeon's office.   Constipation Prevention      Comments:   Drink plenty of fluids.  Prune juice may be helpful.  You may use a stool softener, such as Colace (over the counter) 100 mg twice a day.  Use MiraLax (over the counter) for constipation as needed.   Increase activity slowly as tolerated      Discharge instructions      Comments:   1. Elevate leg 2. Non weight bearing with crutches 3. Keep splint dry 4. Take one 325 mg aspirin a day until return office visit     Medication List  As of 07/26/2011  3:27 PM   TAKE these medications         HYDROcodone-acetaminophen 5-500 MG per tablet   Commonly known as: VICODIN   Take 1 tablet by mouth every 6 (six) hours as needed for pain.      HYDROcodone-acetaminophen 5-325 MG per tablet   Commonly known as: NORCO   Take 1 tablet by mouth every 4 (four) hours as needed. For pain.      ibuprofen 200 MG tablet   Commonly known as: ADVIL,MOTRIN   Take 200 mg by mouth every 8 (eight) hours as needed.           Follow-up Information    Follow up with Molleigh Huot V, MD in 2 weeks.   Contact information:   [address] [place] [address] [phone]          Signed: Nadara Mustard 07/26/2011, 3:27 PM

## 2011-07-26 NOTE — Progress Notes (Signed)
Pt for dc today after PT

## 2011-07-26 NOTE — Evaluation (Signed)
Physical Therapy Evaluation Patient Details Name: Autumn Bishop MRN: [id number] DOB: 03-01-1980 Today's Date: 07/26/2011 Time: 4696-2952 PT Time Calculation (min): 26 min  PT Assessment / Plan / Recommendation Clinical Impression  Patient is a 31 yo female now s/p ORIF left ankle fx.  Patient did well with crutch training and ambulating on stairs with crutches.  Instructed sister in proper guarding technique for stairs.  Patient ready for discharge from PT standpoint.  No f/u PT needs.    PT Assessment  Patent does not need any further PT services    Follow Up Recommendations  No PT follow up;Supervision/Assistance - 24 hour    Barriers to Discharge        Equipment Recommendations  None recommended by PT    Recommendations for Other Services     Frequency      Precautions / Restrictions Precautions Precautions: None Restrictions Weight Bearing Restrictions: Yes LLE Weight Bearing: Non weight bearing         Mobility  Transfers Transfers: Sit to Stand;Stand to Sit Sit to Stand: 5: Supervision;With upper extremity assist;From chair/3-in-1;With armrests Stand to Sit: 5: Supervision;With upper extremity assist;With armrests;To chair/3-in-1 Details for Transfer Assistance: Instructed patient on transfers using correct technique for crutches.  Supervision for safety only. Ambulation/Gait Ambulation/Gait Assistance: 5: Supervision Ambulation Distance (Feet): 130 Feet Assistive device: Crutches Ambulation/Gait Assistance Details: Instructed patient on safe use of crutches.  Patient able to maintain NWB status on LLE. Gait Pattern: Step-to pattern Stairs: Yes Stairs Assistance: 4: Min guard Stair Management Technique: Forwards;With crutches;Step to pattern Number of Stairs: 3       PT Goals  N/A  Visit Information  Last PT Received On: 07/26/11 Assistance Needed: +1    Subjective Data  Subjective: "I've been up on crutches today. Patient Stated Goal: To go home   Prior Functioning  Home Living Lives With: Family Available Help at Discharge: Family;Available 24 hours/day Type of Home: House Home Access: Stairs to enter [employer] of Steps: 2 Entrance Stairs-Rails: None Home Layout: Two level;Able to live on main level with bedroom/bathroom Bathroom Toilet: Standard Home Adaptive Equipment: Crutches Prior Function Level of Independence: Independent Able to Take Stairs?: Yes Driving: Yes Communication Communication: No difficulties    Cognition  Overall Cognitive Status: Appears within functional limits for tasks assessed/performed Arousal/Alertness: Awake/alert Orientation Level: Appears intact for tasks assessed Behavior During Session: [facility] for tasks performed    Extremity/Trunk Assessment Right Upper Extremity Assessment RUE ROM/Strength/Tone: Within functional levels Left Upper Extremity Assessment LUE ROM/Strength/Tone: Within functional levels Right Lower Extremity Assessment RLE ROM/Strength/Tone: Within functional levels Left Lower Extremity Assessment LLE ROM/Strength/Tone: Deficits;Unable to fully assess;Due to pain LLE ROM/Strength/Tone Deficits: Able to move LE against gravity.  Ankle NT   Balance    End of Session PT - End of Session Equipment Utilized During Treatment: Gait belt Activity Tolerance: Patient tolerated treatment well Patient left: in chair;with call bell/phone within reach;with family/visitor present Nurse Communication: Mobility status (Ready for discharge from PT standpoint)  GP Functional Assessment Tool Used: Clinical judgement Functional Limitation: Mobility: Walking and moving around Mobility: Walking and Moving Around Current Status (W4132): At least 1 percent but less than 20 percent impaired, limited or restricted Mobility: Walking and Moving Around Goal Status [phone]): At least 1 percent but less than 20 percent impaired, limited or restricted Mobility: Walking and Moving Around  Discharge Status [phone]): At least 1 percent but less than 20 percent impaired, limited or restricted   Vena Austria 07/26/2011, 3:12  PM Carita Pian. Sanjuana Kava, [place] Pager [phone]

## 2011-07-28 ENCOUNTER — Encounter (HOSPITAL_COMMUNITY): Payer: Self-pay | Admitting: Orthopedic Surgery

## 2015-11-13 ENCOUNTER — Encounter: Payer: Self-pay | Admitting: Emergency Medicine

## 2015-11-13 ENCOUNTER — Ambulatory Visit (INDEPENDENT_AMBULATORY_CARE_PROVIDER_SITE_OTHER): Payer: Managed Care, Other (non HMO)

## 2015-11-13 ENCOUNTER — Ambulatory Visit
Admission: EM | Admit: 2015-11-13 | Discharge: 2015-11-13 | Disposition: A | Discharge status: Home / Self Care | Payer: Managed Care, Other (non HMO) | Attending: Emergency Medicine | Admitting: Emergency Medicine

## 2015-11-13 DIAGNOSIS — S9032XA Contusion of left foot, initial encounter: Secondary | ICD-10-CM

## 2015-11-13 NOTE — ED Provider Notes (Signed)
CSN: [id number]     Arrival date & time 11/13/15  1728 History   First MD Initiated Contact with Patient 11/13/15 1758     Chief Complaint  Patient presents with  . 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 She states that at work another coworker stepped backwards and stepped full weight onto her left foot. At first it hurt but then eventually caused her to limp. Since the injury the pain has worsened and she's had more swelling particularly over her forefoot.  but also on the calcaneus and over the medial malleolus. She had a previous fracture of the Fibula  ORIF with screws and plate. That surgery was in July 2017      Past Medical History:  Diagnosis Date  . Anemia    Past Surgical History:  Procedure Laterality Date  . ORIF ANKLE FRACTURE  07/25/2011   Procedure: OPEN REDUCTION INTERNAL FIXATION (ORIF) ANKLE FRACTURE;  Surgeon: Nadara MustardMarcus V Duda, MD;  Location: [facility] OR;  Service: Orthopedics;  Laterality: Left;  Open Reduction Internal Fixation Left ankle fracture   History reviewed. No pertinent family history. Social History  Substance Use Topics  . Smoking status: Never Smoker  . Smokeless tobacco: Never Used  . Alcohol use Yes     Comment: occasionally   OB History    No data available     Review of Systems  Constitutional: Positive for activity change. Negative for appetite change, chills, fatigue and fever.  Musculoskeletal: Positive for gait problem and joint swelling.  All other systems reviewed and are negative.   Allergies  Review of patient's allergies indicates no known allergies.  Home Medications   Prior to Admission medications   Medication Sig Start Date End Date Taking? Authorizing Provider  levonorgestrel (MIRENA) 20 MCG/24HR IUD 1 each by Intrauterine route once.    Yes Historical Provider, MD  ibuprofen (ADVIL,MOTRIN) 200 MG tablet Take 200 mg by mouth every 8 (eight) hours as needed.    Historical Provider, MD   Meds Ordered and Administered this Visit  Medications - No data to display  BP (!) 143/90 (BP Location: Left Arm)   Pulse 82   Temp 97.8 F (36.6 C) (Tympanic)   Resp 16   Ht 5\' 8"  (1.727 m)   Wt 210 lb (95.3 kg)   SpO2 99%   BMI 31.93 kg/m  No data found.   Physical Exam  Constitutional: She is oriented to person, place, and time. She appears well-developed and well-nourished. No distress.  HENT:  Head: Normocephalic and atraumatic.  Eyes: EOM are normal. Pupils are equal, round, and reactive to light.  Neck: Normal range of motion. Neck supple.  Musculoskeletal: She exhibits edema and tenderness.  Examination of the left foot shows swelling over the forefoot is nonpitting. It extends from the ankle to the metatarsal MTP heads. Is tenderness to palpation along the fifth metatarsal fourth metatarsal third metatarsal maximally. Ankle shows tenderness over the anterior medial malleolus and the deltoid ligament. Range of motion shows dorsiflexion of 20 at our flexion 35 talar motion is intact and comfortable. Does have some mild tenderness to bilateral compression of the calcaneus.  Neurological: She is alert and oriented to person, place, and time.  Skin: Skin is warm and dry. She is not diaphoretic.  Psychiatric: She has a normal mood and affect.  Her behavior is normal. Judgment and thought content normal.  Nursing note and vitals reviewed.   Urgent Care Course   Clinical Course    Procedures (including critical care time)  Labs Review Labs Reviewed - No data to display  Imaging Review Dg Ankle Complete Left  Result Date: 11/13/2015 CLINICAL DATA:  Injury at work. Pain and swelling top of foot and ankle EXAM: LEFT ANKLE COMPLETE - 3+ VIEW COMPARISON:  07/16/2011 FINDINGS: Patient is status post surgical plate and screw  fixation of the left fibula. Old fracture deformity of the distal fibula. The left fourth screw from the top is not anchored within the fixating surgical plate. There are small os ossific densities adjacent to the medial and lateral malleoli, these could represent small age indeterminate avulsion injuries. There is soft tissue swelling medially and laterally. The ankle mortise is symmetric. IMPRESSION: 1. Soft tissue swelling medially and laterally. There are small ossific densities adjacent to the medial malleolus and the fibular malleolus which may represent small age indeterminate avulsion injuries. 2. Status post surgical plate and screw fixation of the fibula. Malpositioned distal fixating screw which is not anchored within the surgical plate. Electronically Signed   By: Jasmine Pang M.D.   On: 11/13/2015 18:34   Dg Foot Complete Left  Result Date: 11/13/2015 CLINICAL DATA:  Fall, pain and swelling EXAM: LEFT FOOT - COMPLETE 3+ VIEW COMPARISON:  None. FINDINGS: Tiny ossific densities adjacent to the medial malleolus and lateral malleolus may relate to small avulsion injuries. Malposition distal screw in the distal fibula. No acute displaced fracture or subluxation of the left foot bones. Small plantar calcaneal spur. No radiopaque foreign body. IMPRESSION: 1. No acute osseous abnormality of the left foot. 2. Small suspected avulsion injuries from the medial and lateral malleolus. Electronically Signed   By: Jasmine Pang M.D.   On: 11/13/2015 18:36     Visual Acuity Review  Right Eye Distance:   Left Eye Distance:   Bilateral Distance:    Right Eye Near:   Left Eye Near:    Bilateral Near:     Patient was fitted for a boot orthosis to help her ambulate and protect her foot to allow her to work.    MDM   1. Contusion of left foot, initial encounter    Plan: 1. Test/x-ray results and diagnosis reviewed with patient 2. rx as per orders; risks, benefits, potential side effects reviewed  with patient 3. Recommend supportive treatment with Elevation to control swelling. Use boot orthosis for active times. Up with orthopedic surgeon if she is not improving in 1-2 weeks. 4. F/u prn if symptoms worsen or don't improve     Lutricia Feil, PA-C 11/13/15 1933    Lutricia Feil, [place] 11/13/15 [phone]

## 2015-11-13 NOTE — ED Triage Notes (Signed)
Patient c/o left foot pain that has gotten worse over the past week. Patient had left ankle surgery in July of this year.

## 2015-11-15 ENCOUNTER — Telehealth: Payer: Self-pay

## 2015-11-15 NOTE — Telephone Encounter (Signed)
Courtesy call back completed today after patients visit at [facility]. Patient improved and will follow up with their PCP if symptoms continue or worsen.   

## 2017-11-30 IMAGING — CR DG ANKLE COMPLETE 3+V*L*
3 series · 3 of 3 positions shown · non-contrast
Comparison: 07/16/2011

CLINICAL DATA: Injury at work. Pain and swelling top of foot and
ankle

EXAM:
LEFT ANKLE COMPLETE - 3+ VIEW

[ankle ap]
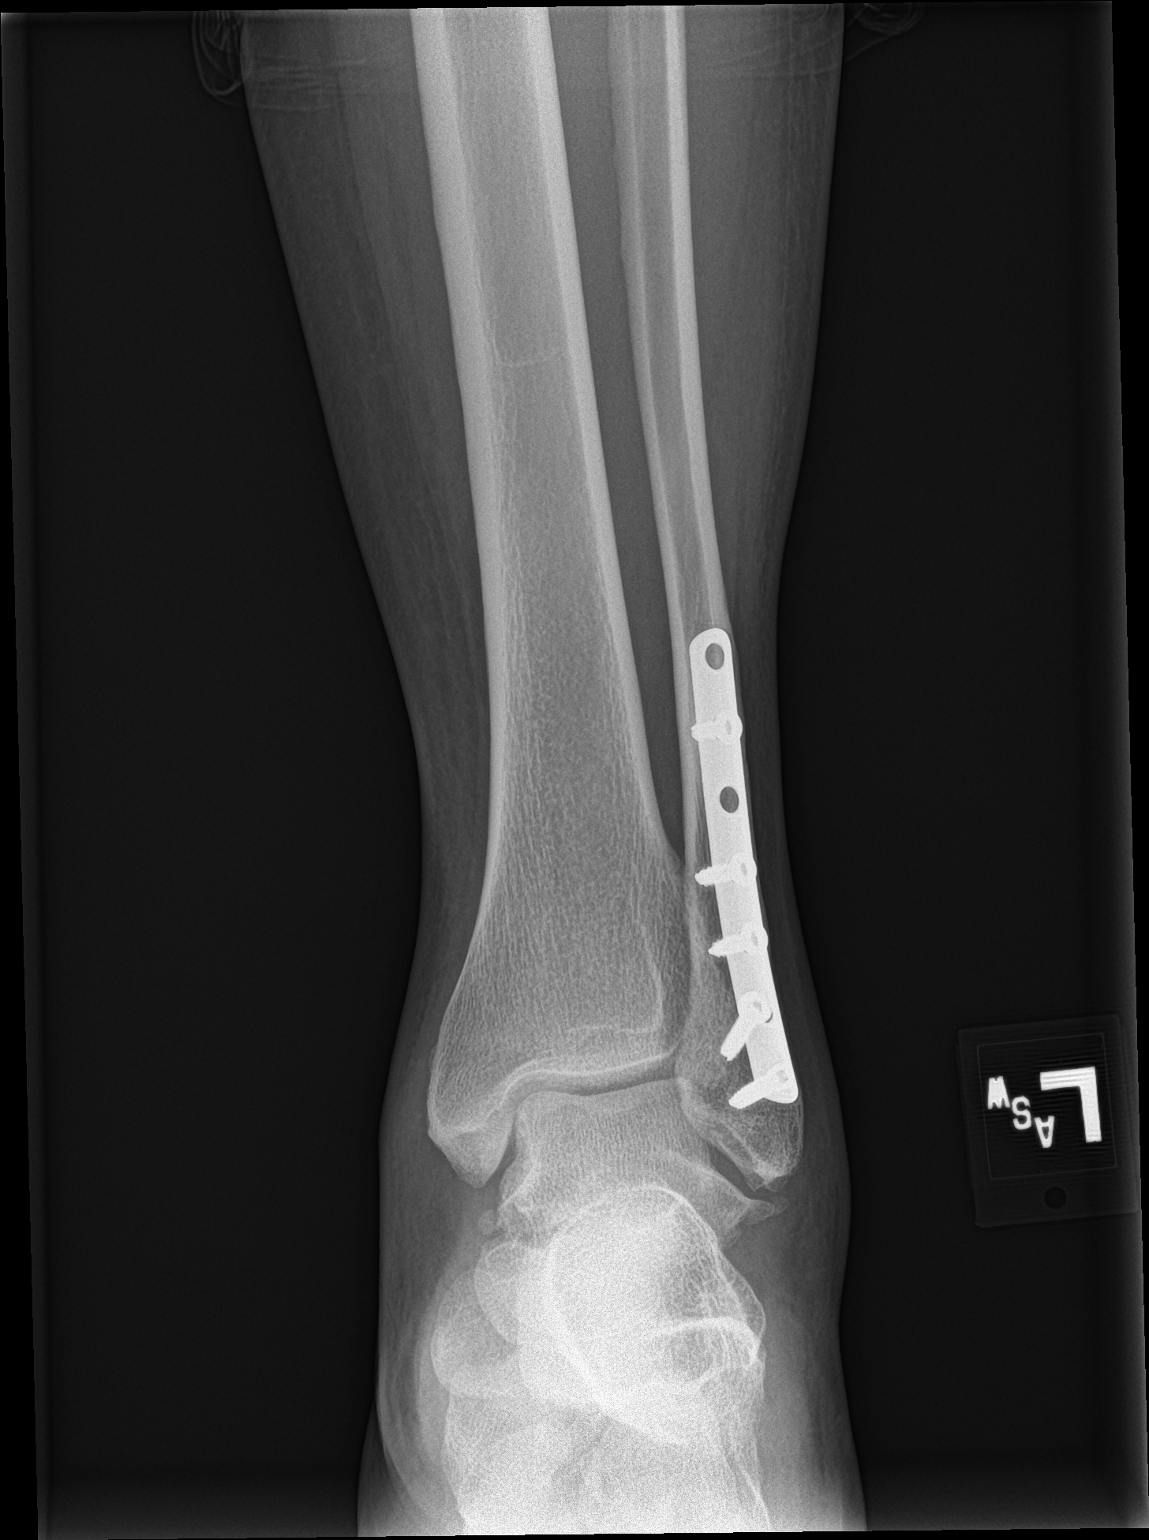

[ankle obl]
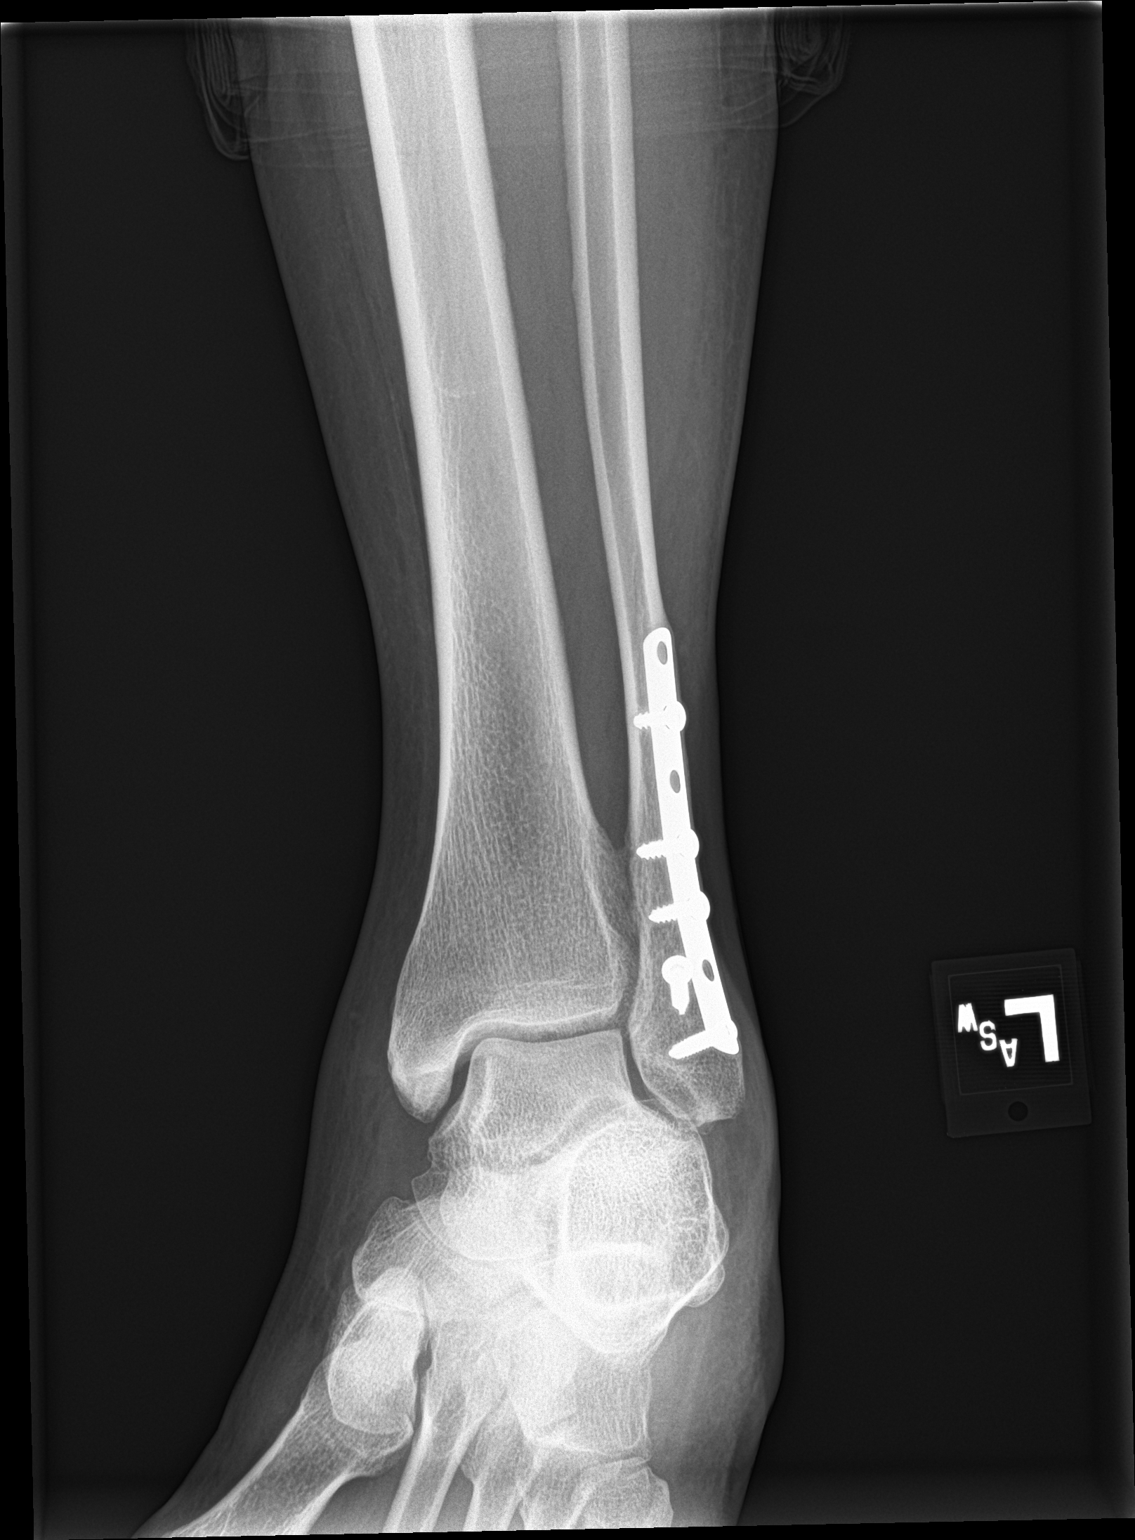

[ankle lat]
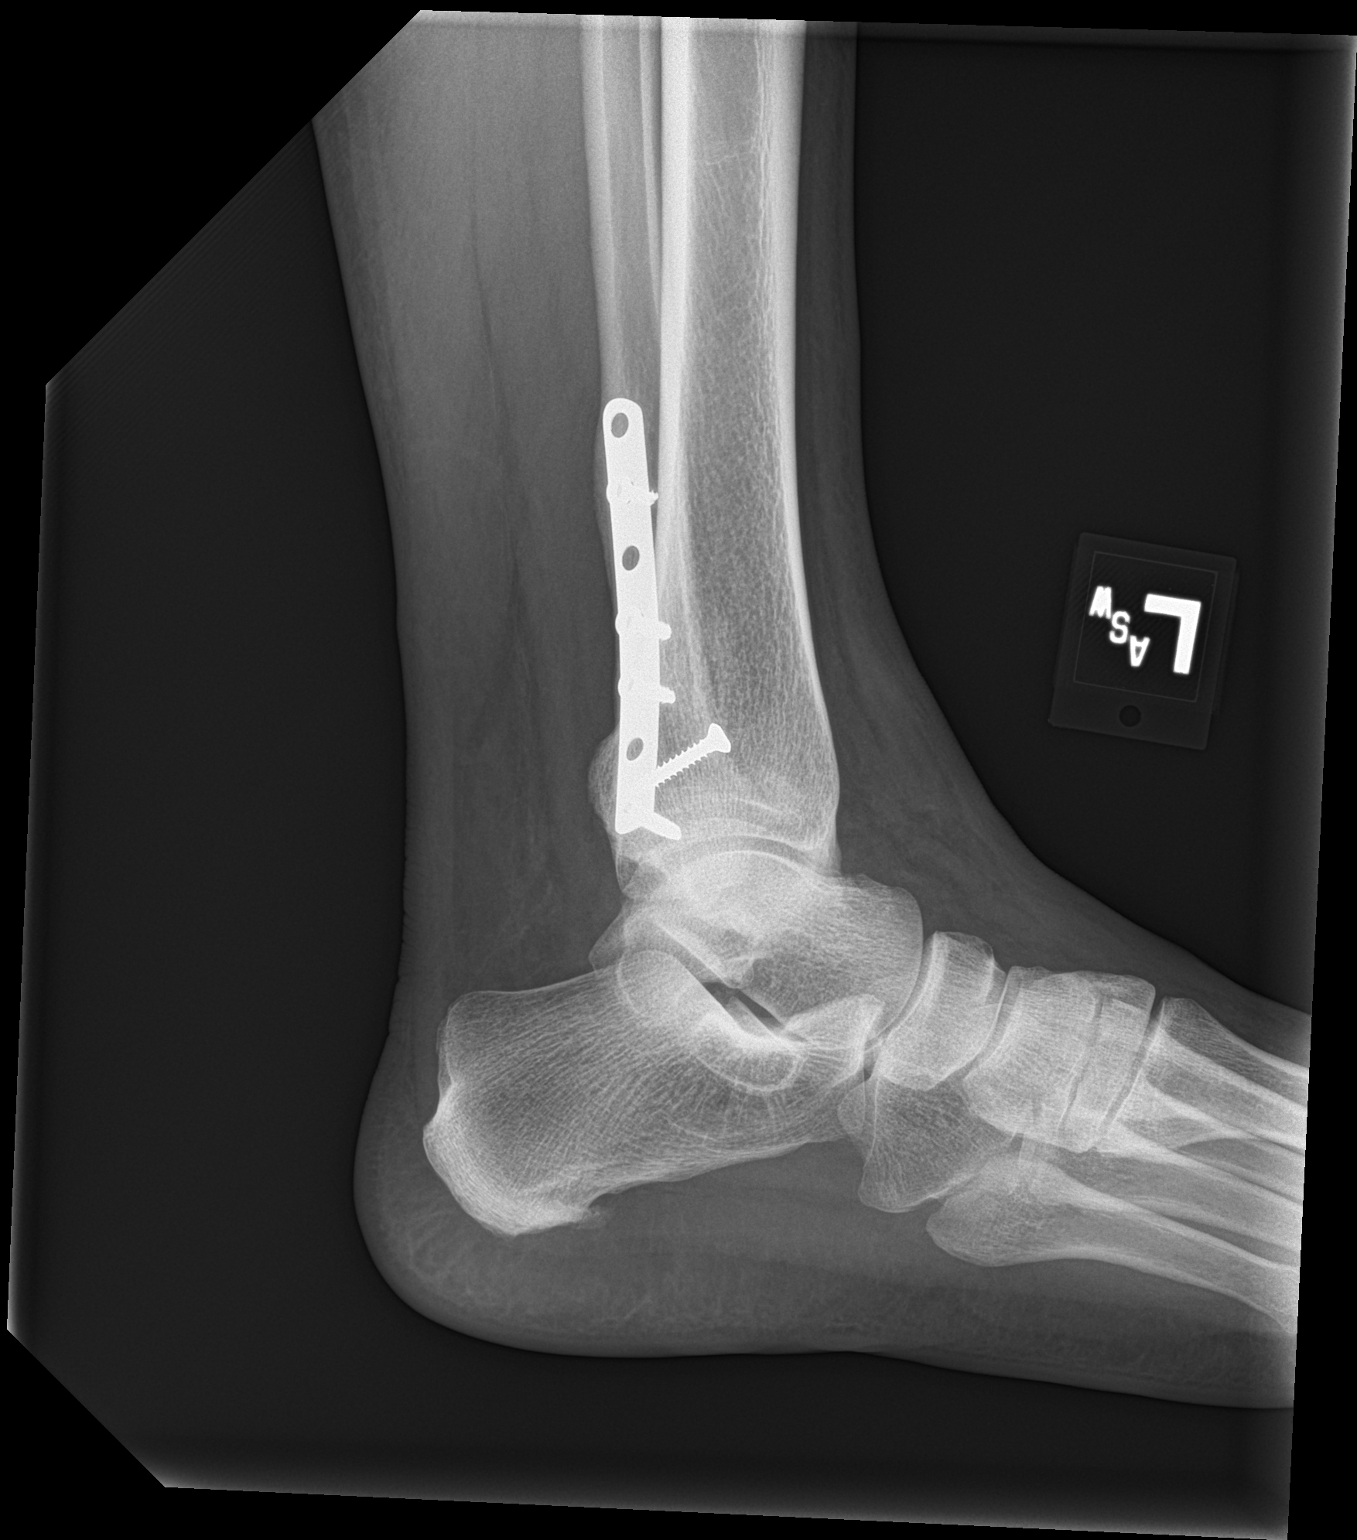

[3 of 3 positions shown; findings below may reference images not displayed]

FINDINGS: Patient is status post surgical plate and screw fixation of the left
fibula. Old fracture deformity of the distal fibula. The left fourth
screw from the top is not anchored within the fixating surgical
plate. There are small os ossific densities adjacent to the medial
and lateral malleoli, these could represent small age indeterminate
avulsion injuries. There is soft tissue swelling medially and
laterally. The ankle mortise is symmetric.
IMPRESSION: 1. Soft tissue swelling medially and laterally. There are small
ossific densities adjacent to the medial malleolus and the fibular
malleolus which may represent small age indeterminate avulsion
injuries.
2. Status post surgical plate and screw fixation of the fibula.
Malpositioned distal fixating screw which is not anchored within the
surgical plate.
